# Patient Record
Sex: Male | Born: 1980 | Hispanic: Yes | Marital: Single | Smoking: Never smoker
Health system: Southern US, Community
[De-identification: ages and names within clinical notes are randomized; demographics above are authoritative.]

---

## 2014-03-15 ENCOUNTER — Encounter (HOSPITAL_COMMUNITY): Payer: Self-pay | Admitting: Emergency Medicine

## 2014-03-15 ENCOUNTER — Emergency Department (HOSPITAL_COMMUNITY): Payer: Self-pay

## 2014-03-15 ENCOUNTER — Emergency Department (HOSPITAL_COMMUNITY)
Admission: EM | Admit: 2014-03-15 | Discharge: 2014-03-15 | Disposition: A | Discharge status: Home / Self Care | Payer: Self-pay | Attending: Emergency Medicine | Admitting: Emergency Medicine

## 2014-03-15 DIAGNOSIS — N410 Acute prostatitis: Secondary | ICD-10-CM

## 2014-03-15 DIAGNOSIS — M545 Low back pain, unspecified: Secondary | ICD-10-CM | POA: Insufficient documentation

## 2014-03-15 DIAGNOSIS — Z792 Long term (current) use of antibiotics: Secondary | ICD-10-CM | POA: Insufficient documentation

## 2014-03-15 DIAGNOSIS — R300 Dysuria: Secondary | ICD-10-CM | POA: Insufficient documentation

## 2014-03-15 DIAGNOSIS — R350 Frequency of micturition: Secondary | ICD-10-CM | POA: Insufficient documentation

## 2014-03-15 DIAGNOSIS — Z79899 Other long term (current) drug therapy: Secondary | ICD-10-CM | POA: Insufficient documentation

## 2014-03-15 DIAGNOSIS — R509 Fever, unspecified: Secondary | ICD-10-CM | POA: Insufficient documentation

## 2014-03-15 LAB — CBC WITH DIFFERENTIAL/PLATELET
Basophils Absolute: 0 10*3/uL (ref 0.0–0.1)
Basophils Relative: 0 % (ref 0–1)
EOS PCT: 0 % (ref 0–5)
Eosinophils Absolute: 0 10*3/uL (ref 0.0–0.7)
HEMATOCRIT: 43.6 % (ref 39.0–52.0)
Hemoglobin: 14.9 g/dL (ref 13.0–17.0)
LYMPHS ABS: 1 10*3/uL (ref 0.7–4.0)
Lymphocytes Relative: 12 % (ref 12–46)
MCH: 29.9 pg (ref 26.0–34.0)
MCHC: 34.2 g/dL (ref 30.0–36.0)
MCV: 87.6 fL (ref 78.0–100.0)
MONO ABS: 0.4 10*3/uL (ref 0.1–1.0)
MONOS PCT: 5 % (ref 3–12)
Neutro Abs: 7.1 10*3/uL (ref 1.7–7.7)
Neutrophils Relative %: 83 % — ABNORMAL HIGH (ref 43–77)
PLATELETS: 190 10*3/uL (ref 150–400)
RBC: 4.98 MIL/uL (ref 4.22–5.81)
RDW: 13.3 % (ref 11.5–15.5)
WBC: 8.6 10*3/uL (ref 4.0–10.5)

## 2014-03-15 LAB — URINALYSIS, ROUTINE W REFLEX MICROSCOPIC
Bilirubin Urine: NEGATIVE
Glucose, UA: NEGATIVE mg/dL
Hgb urine dipstick: NEGATIVE
Ketones, ur: NEGATIVE mg/dL
LEUKOCYTES UA: NEGATIVE
Nitrite: NEGATIVE
Protein, ur: NEGATIVE mg/dL
Specific Gravity, Urine: 1.013 (ref 1.005–1.030)
UROBILINOGEN UA: 0.2 mg/dL (ref 0.0–1.0)
pH: 6.5 (ref 5.0–8.0)

## 2014-03-15 LAB — I-STAT CG4 LACTIC ACID, ED: LACTIC ACID, VENOUS: 2.04 mmol/L (ref 0.5–2.2)

## 2014-03-15 LAB — BASIC METABOLIC PANEL
Anion gap: 13 (ref 5–15)
BUN: 12 mg/dL (ref 6–23)
CALCIUM: 9.3 mg/dL (ref 8.4–10.5)
CO2: 24 meq/L (ref 19–32)
Chloride: 102 mEq/L (ref 96–112)
Creatinine, Ser: 0.74 mg/dL (ref 0.50–1.35)
GFR calc Af Amer: >90 mL/min (ref >90)
GFR calc non Af Amer: >90 mL/min (ref >90)
Glucose, Bld: 99 mg/dL (ref 70–99)
Potassium: 3.8 mEq/L (ref 3.7–5.3)
SODIUM: 139 meq/L (ref 137–147)

## 2014-03-15 MED ORDER — IOHEXOL 300 MG/ML  SOLN
25.0000 mL | INTRAMUSCULAR | Status: AC
  Administered 2014-03-15: 25 mL via ORAL

## 2014-03-15 MED ORDER — MORPHINE SULFATE 4 MG/ML IJ SOLN
4.0000 mg | Freq: Once | INTRAMUSCULAR | Status: AC
  Administered 2014-03-15: 4 mg via INTRAVENOUS
  Filled 2014-03-15: qty 1

## 2014-03-15 MED ORDER — OXYCODONE-ACETAMINOPHEN 5-325 MG PO TABS: 1.0000 | ORAL_TABLET | ORAL | Status: DC | PRN

## 2014-03-15 MED ORDER — IOHEXOL 300 MG/ML  SOLN
100.0000 mL | Freq: Once | INTRAMUSCULAR | Status: AC | PRN
  Administered 2014-03-15: 100 mL via INTRAVENOUS

## 2014-03-15 MED ORDER — CIPROFLOXACIN IN D5W 400 MG/200ML IV SOLN
400.0000 mg | Freq: Once | INTRAVENOUS | Status: AC
  Administered 2014-03-15: 400 mg via INTRAVENOUS
  Filled 2014-03-15: qty 200

## 2014-03-15 MED ORDER — IBUPROFEN 800 MG PO TABS: 800.0000 mg | ORAL_TABLET | Freq: Three times a day (TID) | ORAL | Status: DC

## 2014-03-15 MED ORDER — SODIUM CHLORIDE 0.9 % IV BOLUS (SEPSIS)
1000.0000 mL | Freq: Once | INTRAVENOUS | Status: AC
  Administered 2014-03-15: 1000 mL via INTRAVENOUS

## 2014-03-15 NOTE — ED Notes (Signed)
To ct

## 2014-03-15 NOTE — ED Notes (Signed)
Pt reports having lower back pain since yesterday and pain with urination. Denies any back injury or hx of back pain. Ambulatory at triage, no acute distress noted.

## 2014-03-15 NOTE — ED Provider Notes (Signed)
CSN: [id number]     Arrival date & time 03/15/14  7829 History   First MD Initiated Contact with Patient 03/15/14 0912     Chief Complaint  Patient presents with  . Back Pain     (Consider location/radiation/quality/duration/timing/severity/associated sxs/prior Treatment) HPI Comments: Patient is a 33 yo M presenting to the ED for continued low back and suprapubic pain after being diagnosed with acute prostatitis at an [facility] on Saturday. Patient states he has had continued pain and dysuria despite taking the Cipro and Proscar as prescribed. Patient was not given anything for pain. He states he was given two antibiotics at the [facility], one was an injection prior to leaving. He endorses subjective fevers and chills. No abdominal surgical history.   Patient is a 33 y.o. male presenting with back pain.  Back Pain Associated symptoms: dysuria and fever (subjective)     History reviewed. No pertinent past medical history. History reviewed. No pertinent past surgical history. History reviewed. No pertinent family history. History  Substance Use Topics  . Smoking status: Not on file  . Smokeless tobacco: Not on file  . Alcohol Use: No    Review of Systems  Constitutional: Positive for fever (subjective) and chills.  Genitourinary: Positive for dysuria, urgency, frequency and decreased urine volume. Negative for hematuria, flank pain, discharge, penile swelling, scrotal swelling, genital sores, penile pain and testicular pain.  Musculoskeletal: Positive for back pain.  All other systems reviewed and are negative.     Allergies  Review of patient's allergies indicates no known allergies.  Home Medications   Prior to Admission medications   Medication Sig Start Date End Date Taking? Authorizing Provider  ciprofloxacin (CIPRO) 500 MG tablet Take 500 mg by mouth 2 (two) times daily.   Yes Historical Provider, MD  finasteride (PROSCAR) 5 MG tablet Take 5 mg by mouth daily.   Yes Historical  Provider, MD  ibuprofen (ADVIL,MOTRIN) 800 MG tablet Take 1 tablet (800 mg total) by mouth 3 (three) times daily. 03/15/14   Jaking Thayer L Ichiro Chesnut, PA-C  oxyCODONE-acetaminophen (PERCOCET/ROXICET) 5-325 MG per tablet Take 1 tablet by mouth every 4 (four) hours as needed for severe pain. May take 2 tablets PO q 6 hours for severe pain - Do not take with Tylenol as this tablet already contains tylenol 03/15/14   Orrie Lascano L Arshia Spellman, PA-C   BP 119/65  Pulse 77  Temp(Src) 98.3 F (36.8 C) (Oral)  Resp 15  Ht  (1.6 m)  Wt 120 lb (54.432 kg)  BMI 21.26 kg/m2  SpO2 99% Physical Exam  Nursing note and vitals reviewed. Constitutional: He is oriented to person, place, and time. He appears well-developed and well-nourished. No distress.  HENT:  Head: Normocephalic and atraumatic.  Right Ear: External ear normal.  Left Ear: External ear normal.  Nose: Nose normal.  Mouth/Throat: Oropharynx is clear and moist.  Eyes: Conjunctivae are normal.  Neck: Normal range of motion. Neck supple.  Cardiovascular: Normal rate, regular rhythm and normal heart sounds.   Pulmonary/Chest: Effort normal and breath sounds normal. No respiratory distress.  Abdominal: Soft. Bowel sounds are normal. He exhibits no distension. There is tenderness (mild suprapubic). There is no rebound and no guarding.  Genitourinary: Prostate is tender. Prostate is not enlarged.  Brown stool noted on DRE.   Musculoskeletal: Normal range of motion. He exhibits no edema.  Neurological: He is alert and oriented to person, place, and time.  Skin: Skin is warm and dry. He is not diaphoretic.  Psychiatric: He has a normal mood and affect.    ED Course  Procedures (including critical care time) Medications  sodium chloride 0.9 % bolus 1,000 mL (0 mLs Intravenous Stopped 03/15/14 1401)  morphine 4 MG/ML injection 4 mg (4 mg Intravenous Given 03/15/14 1107)  iohexol (OMNIPAQUE) 300 MG/ML solution 25 mL (25 mLs Oral Contrast Given  03/15/14 1031)  ciprofloxacin (CIPRO) IVPB 400 mg (0 mg Intravenous Stopped 03/15/14 1401)  iohexol (OMNIPAQUE) 300 MG/ML solution 100 mL (100 mLs Intravenous Contrast Given 03/15/14 1312)  morphine 4 MG/ML injection 4 mg (4 mg Intravenous Given 03/15/14 1334)    Labs Review Labs Reviewed  CBC WITH DIFFERENTIAL - Abnormal; Notable for the following:    Neutrophils Relative % 83 (*)    All other components within normal limits  URINE CULTURE  URINALYSIS, ROUTINE W REFLEX MICROSCOPIC  BASIC METABOLIC PANEL  I-STAT CG4 LACTIC ACID, ED    Imaging Review Ct Abdomen Pelvis W Contrast  03/15/2014   CLINICAL DATA:  Low back pain, dysuria, recent diagnosis of prostatitis  EXAM: CT ABDOMEN AND PELVIS WITH CONTRAST  TECHNIQUE: Multidetector CT imaging of the abdomen and pelvis was performed using the standard protocol following bolus administration of intravenous contrast.  CONTRAST:  OMNIPAQUE IOHEXOL 300 MG/ML  SOLN  COMPARISON:  None.  FINDINGS: Lung bases are essentially clear.  Liver, spleen, pancreas, and adrenal glands within normal limits.  Gallbladder is unremarkable. No intrahepatic or extrahepatic ductal dilatation.  Kidneys are within normal limits.  No hydronephrosis.  No evidence of bowel obstruction.  Normal appendix.  No evidence of abdominal aortic aneurysm.  No abdominopelvic ascites.  No suspicious abdominopelvic lymphadenopathy.  Prostate is grossly unremarkable on CT.  Moderately distended bladder.  Visualized osseous structures are within normal limits.  IMPRESSION: Prostate is grossly unremarkable on CT.  Moderately distended bladder.  Otherwise, no CT findings to account for the patient's abdominal pain.   Electronically Signed   By: Charline Bills M.D.   On: 03/15/2014 13:46     EKG Interpretation None      MDM   Final diagnoses:  Acute prostatitis    Filed Vitals:   03/15/14 1430  BP: 119/65  Pulse: 77  Temp:   Resp: 15   Afebrile, NAD, non-toxic appearing,  AAOx4.  I have reviewed nursing notes, vital signs, and all appropriate lab and imaging results for this patient.  Patient with continued back and suprapubic pain after being diagnosed with acute prostatitis on Saturday despite taking antibiotic as prescribed. Prostate tender on examination. Abdomen soft, non-tender, non-distended. Labs reviewed, no evidence of spreading infection or abscess on CT scan. Dose of IV Cipro given in ED. Patient already covered for GC[facility]Chlamydia at [facility]. Advised to finish antibiotic course as prescribed. Will provide pain medication for symptom control. Return precautions discussed. Advised Urology f/u. Patient is agreeable to plan. Patient is stable at time of discharge      Jeannetta Ellis, PA-C 03/15/14 1450

## 2014-03-15 NOTE — Discharge Instructions (Signed)
Please follow up with your primary care physician in 1-2 days. If you do not have one please call the [facility] and wellness Center number listed above. Please follow up with Dr. Patsi Sears to schedule a follow up appointment.  Please take your antibiotic until completion. Please take pain medication and/or muscle relaxants as prescribed and as needed for pain. Please do not drive on narcotic pain medication or on muscle relaxants. Please read all discharge instructions and return precautions.    Prostatitis ( Prostatitis) La prstata tiene aproximadamente el tamao y la forma de Sharee Pimple. Se ubica justo debajo de la vejiga. Produce uno de los componentes del semen, formado por los espermatozoides y los lquidos que ayudan a nutrirlos y transportarlos fuera de los testculos. La prostatitis es una inflamacin de la prstata.  Hay cuatro tipos de prostatitis:  Prostatitis bacteriana aguda. Es el tipo menos frecuente de prostatitis. Comienza rpidamente y, por lo general, est acompaada por una infeccin de la vejiga, fiebre alta y escalofros. Puede ocurrir a Actuary.  Prostatitis bacteriana crnica. Es una infeccin bacteriana persistente en la prstata. Generalmente aparece luego de una prostatitis bacteriana aguda que se repite o que no fue tratada adecuadamente. Se puede presentar en hombres de cualquier edad pero es ms comn en los hombres de mediana edad cuya prstata ha comenzado a Government social research officer. Los sntomas no son tan graves como los de la prostatitis [place]. La principal [employer] ser en la parte del cuerpo que est delante del recto y debajo del escroto (perineo), en la parte baja del abdomen o en la punta del pene (glande).   Prostatitis crnica (no bacteriana). Es el tipo ms frecuente de prostatitis. Es la inflamacin de la prstata y no se origina por una infeccin bacteriana. Se desconoce su causa y puede estar asociada con infecciones virales o trastornos  autoinmunitarios.  Prostatodinia (trastorno del suelo plvico). Est asociada con un aumento del tono muscular en la pelvis que rodea a la prstata. CAUSAS La causa de la prostatitis bacteriana es una infeccin bacteriana. Se desconocen las causas de los otros tipos de prostatitis.  SNTOMAS  Los sntomas pueden variar segn el tipo de prostatitis. Tambin puede ocurrir que coincidan sntomas de diferentes tipos de prostatitis. Los sntomas posibles de cada tipo de prostatitis se enumeran a continuacin. Prostatitis bacteriana aguda  Dolor al [employer].  Fiebre o escalofros.  Dolor muscular o en las articulaciones.  Dolor lumbar.  Dolor en la zona inferior del abdomen.  Imposibilidad de vaciar la vejiga completamente. Prostatitis bacteriana crnica, prostatitis crnica no bacteriana y prostatodinia  [place]ongo urgente y repentina de Geographical information systems officer.  Ganas de orinar con frecuencia.  Dificultad para comenzar a eliminar la orina.  Chorro de orina dbil.  Secrecin por Engineer, mining.  Goteo al terminar de Geographical information systems officer.  Dolor rectal.  Dolor en los testculos, el pene o la punta del pene.  Dolor en el perineo.  Problemas con la funcin sexual.  Eyaculacin dolorosa.  Semen con sangre. DIAGNSTICO  Su mdico le preguntar acerca de sus sntomas para hacer el diagnstico de prostatitis. Se recolectarn y analizarn una o ms muestras de [place] (anlisis de [place]). Si el resultado del anlisis de [place] es negativo para bacterias, su mdico podr palpar su prstata con un dedo (examen dgito rectal). Este examen ayuda a su mdico a determinar si la prstata est inflamada y sensible. Tambin se obtendr Lauris Poag de semen para analizarla. TRATAMIENTO  El tratamiento de la prostatitis depende de la causa. Si  la causa es una infeccin [place], se puede tratar con antibiticos. En los casos de prostatitis bacteriana crnica, puede ser necesario el uso de antibiticos durante hasta o  6semanas. Su mdico puede indicarle que tome baos de asiento para ayudar a Engineer, materials. Un bao de asiento es un bao de agua caliente en la que las caderas y las nalgas estn bajo el agua. Esto relaja los msculos del suelo plvico y, a menudo, contribuye a Technical sales engineer presin en la prstata. INSTRUCCIONES PARA EL CUIDADO EN EL HOGAR   Tome todos los medicamentos como le indic el mdico.  Tome baos de asiento segn las indicaciones del mdico. SOLICITE ATENCIN MDICA SI:   Los sntomas empeoran en lugar de mejorar.  Tiene fiebre. SOLICITE ATENCIN MDICA DE INMEDIATO SI:   Tiene escalofros.  Siente nuseas o vomita.  Se siente mareado o se desmaya.  No puede orinar.  Tiene sangre o cogulos de sangre en la orina. ASEGRESE DE QUE:  Comprende estas instrucciones.  Controlar su afeccin.  Recibir ayuda de inmediato si no mejora o si empeora. Document Released: 04/17/2005 Document Revised: 07/13/2013 [facility] Patient Information 2015 [place], [place]. This information is not intended to replace advice given to you by your health care provider. Make sure you discuss any questions you have with your health care provider.

## 2014-03-15 NOTE — ED Provider Notes (Signed)
Medical screening examination/treatment/procedure(s) were performed by non-physician practitioner and as supervising physician I was immediately available for consultation/collaboration.     Suraiya Dickerson, MD 03/15/14 1635 

## 2014-03-15 NOTE — ED Notes (Signed)
Lab results was given to Dr.Delo.

## 2014-03-15 NOTE — ED Notes (Signed)
Iv therapist returned call will come down as soon as possible

## 2014-03-15 NOTE — ED Notes (Signed)
Call placed to iv therapy 

## 2014-03-15 NOTE — ED Notes (Signed)
C/op lower back pain onset last week worse today. Denies injury.

## 2014-03-15 NOTE — ED Notes (Signed)
Remains in ct

## 2014-03-15 NOTE — ED Notes (Signed)
Call placed to iv therapy

## 2014-03-16 LAB — URINE CULTURE
Colony Count: NO GROWTH
Culture: NO GROWTH

## 2014-03-31 ENCOUNTER — Emergency Department (HOSPITAL_COMMUNITY)
Admission: EM | Admit: 2014-03-31 | Discharge: 2014-04-01 | Disposition: A | Discharge status: Home / Self Care | Payer: Self-pay | Attending: Emergency Medicine | Admitting: Emergency Medicine

## 2014-03-31 ENCOUNTER — Encounter (HOSPITAL_COMMUNITY): Payer: Self-pay | Admitting: Emergency Medicine

## 2014-03-31 DIAGNOSIS — Z792 Long term (current) use of antibiotics: Secondary | ICD-10-CM | POA: Insufficient documentation

## 2014-03-31 DIAGNOSIS — R509 Fever, unspecified: Secondary | ICD-10-CM | POA: Insufficient documentation

## 2014-03-31 DIAGNOSIS — N4281 Prostatodynia syndrome: Secondary | ICD-10-CM

## 2014-03-31 DIAGNOSIS — N4289 Other specified disorders of prostate: Secondary | ICD-10-CM | POA: Insufficient documentation

## 2014-03-31 DIAGNOSIS — Z791 Long term (current) use of non-steroidal anti-inflammatories (NSAID): Secondary | ICD-10-CM | POA: Insufficient documentation

## 2014-03-31 NOTE — ED Notes (Signed)
Pt was seen at Urgent Care today and dx with epididimytis, he received rocephin and toradol, he complains of rectal pain and hip pain for two days also

## 2014-04-01 ENCOUNTER — Encounter (HOSPITAL_COMMUNITY): Payer: Self-pay | Admitting: Emergency Medicine

## 2014-04-01 LAB — CBC WITH DIFFERENTIAL/PLATELET
Basophils Absolute: 0 10*3/uL (ref 0.0–0.1)
Basophils Relative: 0 % (ref 0–1)
EOS PCT: 0 % (ref 0–5)
Eosinophils Absolute: 0 10*3/uL (ref 0.0–0.7)
HEMATOCRIT: 41.2 % (ref 39.0–52.0)
Hemoglobin: 14.4 g/dL (ref 13.0–17.0)
LYMPHS ABS: 1.6 10*3/uL (ref 0.7–4.0)
LYMPHS PCT: 22 % (ref 12–46)
MCH: 30.6 pg (ref 26.0–34.0)
MCHC: 35 g/dL (ref 30.0–36.0)
MCV: 87.7 fL (ref 78.0–100.0)
Monocytes Absolute: 0.5 10*3/uL (ref 0.1–1.0)
Monocytes Relative: 7 % (ref 3–12)
Neutro Abs: 5.2 10*3/uL (ref 1.7–7.7)
Neutrophils Relative %: 71 % (ref 43–77)
Platelets: 261 10*3/uL (ref 150–400)
RBC: 4.7 MIL/uL (ref 4.22–5.81)
RDW: 13.2 % (ref 11.5–15.5)
WBC: 7.3 10*3/uL (ref 4.0–10.5)

## 2014-04-01 LAB — BASIC METABOLIC PANEL
Anion gap: 11 (ref 5–15)
BUN: 12 mg/dL (ref 6–23)
CO2: 27 meq/L (ref 19–32)
CREATININE: 0.76 mg/dL (ref 0.50–1.35)
Calcium: 9.5 mg/dL (ref 8.4–10.5)
Chloride: 102 mEq/L (ref 96–112)
GFR calc Af Amer: >90 mL/min (ref >90)
Glucose, Bld: 114 mg/dL — ABNORMAL HIGH (ref 70–99)
Potassium: 4.3 mEq/L (ref 3.7–5.3)
SODIUM: 140 meq/L (ref 137–147)

## 2014-04-01 LAB — URINALYSIS, ROUTINE W REFLEX MICROSCOPIC
Bilirubin Urine: NEGATIVE
GLUCOSE, UA: NEGATIVE mg/dL
HGB URINE DIPSTICK: NEGATIVE
Ketones, ur: NEGATIVE mg/dL
LEUKOCYTES UA: NEGATIVE
Nitrite: NEGATIVE
PH: 6 (ref 5.0–8.0)
PROTEIN: NEGATIVE mg/dL
Specific Gravity, Urine: 1.014 (ref 1.005–1.030)
Urobilinogen, UA: 0.2 mg/dL (ref 0.0–1.0)

## 2014-04-01 LAB — SEDIMENTATION RATE: SED RATE: 10 mm/h (ref 0–16)

## 2014-04-01 MED ORDER — OXYCODONE-ACETAMINOPHEN 5-325 MG PO TABS
2.0000 | ORAL_TABLET | Freq: Once | ORAL | Status: AC
  Administered 2014-04-01: 2 via ORAL
  Filled 2014-04-01: qty 2

## 2014-04-01 MED ORDER — OXYCODONE-ACETAMINOPHEN 5-325 MG PO TABS: 2.0000 | ORAL_TABLET | ORAL | Status: DC | PRN

## 2014-04-01 NOTE — Discharge Instructions (Signed)
Please purchase a thermometer and keep a record when your temperature is above 100.4  You can take ibuprofen or tylenol for fevers.  Percocet has tyenol in it so do not double dose!  Follow up in 1 week with your doctor for recheck of your pain.  Please bring any records of your fevers.  Return to the ER for worsening condition or new concerning symptoms.   Por favor, compre un termmetro y Pharmacologist un registro cuando su temperatura es superior a 100,4 Usted puede tomar ibuprofeno o Tylenol para las fiebres. Percocet tiene tyenol en ella, as no lo hacen dosis doble! Seguimiento en 1 semana con su mdico para obtener? Servicios de Agricultural consultant. Por favor traer [employer] documentos de sus fiebres. Regreso a la sala de urgencias por empeoramiento de condiciones o nuevos referentes sntomas.

## 2014-04-01 NOTE — ED Provider Notes (Signed)
CSN: [id number]     Arrival date & time 03/31/14  2007 History   First MD Initiated Contact with Patient 04/01/14 0021     Chief Complaint  Patient presents with  . Fever     (Consider location/radiation/quality/duration/timing/severity/associated sxs/prior Treatment) HPI 33 year old male presents to emergency department with complaint of fevers for 2 weeks, back pain and genital pain for 2 weeks.  Fever is subjective, and characterized by trembling and feeling hot all over.  Patient has been seen in the emergency department under a different name,  Kota Ciancio, on 8/25 and diagnosed with acute prostatitis.  Patient was treated for GC and Chlamydia and placed on Cipro.  He followed up with urology, and her patient, was told he had muscle swelling and not prostatitis.  Patient has had persistent symptoms.  He was seen at urgent care today and diagnosed with epididymitis.  He was given Rocephin and Toradol which helped with pain slightly.  Patient complains of pain in the perineum.  Patient afebrile here.  He denies any dysuria or penile discharge.  No prior history of same. History reviewed. No pertinent past medical history. History reviewed. No pertinent past surgical history. History reviewed. No pertinent family history. History  Substance Use Topics  . Smoking status: Never Smoker   . Smokeless tobacco: Not on file  . Alcohol Use: No    Review of Systems   See History of Present Illness; otherwise all other systems are reviewed and negative  Allergies  Review of patient's allergies indicates no known allergies.  Home Medications   Prior to Admission medications   Medication Sig Start Date End Date Taking? Authorizing Provider  cefTRIAXone (ROCEPHIN) 250 MG injection Inject 250 mg into the muscle once. FOR IM use in LARGE MUSCLE MASS   Yes Historical Provider, MD  doxycycline (DORYX) 100 MG DR capsule Take 100 mg by mouth 2 (two) times daily. For 7 days   Yes Historical  Provider, MD  ibuprofen (ADVIL,MOTRIN) 800 MG tablet Take 800 mg by mouth 2 (two) times daily as needed for fever.   Yes Historical Provider, MD  ketorolac (TORADOL) 60 MG/2ML SOLN injection Inject 60 mg into the muscle once.   Yes Historical Provider, MD   BP 122/71  Pulse 82  Temp(Src) 98.2 F (36.8 C) (Oral)  Resp 18  SpO2 100% Physical Exam  Nursing note and vitals reviewed. Constitutional: He is oriented to person, place, and time. He appears well-developed and well-nourished.  HENT:  Head: Normocephalic and atraumatic.  Nose: Nose normal.  Mouth/Throat: Oropharynx is clear and moist.  Eyes: Conjunctivae and EOM are normal. Pupils are equal, round, and reactive to light.  Neck: Normal range of motion. Neck supple. No JVD present. No tracheal deviation present. No thyromegaly present.  Cardiovascular: Normal rate, regular rhythm, normal heart sounds and intact distal pulses.  Exam reveals no gallop and no friction rub.   No murmur heard. Pulmonary/Chest: Effort normal and breath sounds normal. No stridor. No respiratory distress. He has no wheezes. He has no rales. He exhibits no tenderness.  Abdominal: Soft. Bowel sounds are normal. He exhibits no distension and no mass. There is no tenderness. There is no rebound and no guarding.  Genitourinary: Rectum normal and penis normal.  Patient without penile discharge.  He has mild tenderness bilaterally in testicles.  He is exquisitely tender in the perineum and on rectal exam over the prostate without bogginess noted  Musculoskeletal: Normal range of motion. He exhibits no edema and  no tenderness.  Lymphadenopathy:    He has no cervical adenopathy.  Neurological: He is alert and oriented to person, place, and time. He displays normal reflexes. He exhibits normal muscle tone. Coordination normal.  Skin: Skin is warm and dry. No rash noted. No erythema. No pallor.  Psychiatric: He has a normal mood and affect. His behavior is normal.  Judgment and thought content normal.    ED Course  Procedures (including critical care time) Labs Review Labs Reviewed  BASIC METABOLIC PANEL - Abnormal; Notable for the following:    Glucose, Bld 114 (*)    All other components within normal limits  CULTURE, BLOOD (ROUTINE X 2)  CULTURE, BLOOD (ROUTINE X 2)  URINALYSIS, ROUTINE W REFLEX MICROSCOPIC  CBC WITH DIFFERENTIAL  SEDIMENTATION RATE    Imaging Review No results found.   EKG Interpretation None      MDM   Final diagnoses:  Prostate pain    33 year old male with persistent perineal and genital pain with reported fevers.  Patient advised that he needs to buy a thermometer and document his fevers.  He was seen at urgent care earlier today, or and it was placed on doxycycline.  Blood Cultures and sedimentation rate were sent.  Will have patient follow back up with urgent care in one week for recheck of fevers and perineal pain.  Patient may need referral back to urology.    Olivia Mackie, MD 04/01/14 [phone]

## 2014-04-07 LAB — CULTURE, BLOOD (ROUTINE X 2)
Culture: NO GROWTH
Culture: NO GROWTH

## 2019-01-16 ENCOUNTER — Ambulatory Visit (HOSPITAL_COMMUNITY)
Admission: EM | Admit: 2019-01-16 | Discharge: 2019-01-16 | Disposition: A | Discharge status: Home / Self Care | Payer: Self-pay | Attending: Physician Assistant | Admitting: Physician Assistant

## 2019-01-16 ENCOUNTER — Encounter (HOSPITAL_COMMUNITY): Payer: Self-pay

## 2019-01-16 ENCOUNTER — Other Ambulatory Visit: Payer: Self-pay

## 2019-01-16 DIAGNOSIS — K219 Gastro-esophageal reflux disease without esophagitis: Secondary | ICD-10-CM

## 2019-01-16 DIAGNOSIS — R1032 Left lower quadrant pain: Secondary | ICD-10-CM

## 2019-01-16 MED ORDER — POLYETHYLENE GLYCOL 3350 17 G PO PACK: 17.0000 g | PACK | Freq: Every day | ORAL | 0 refills | Status: AC

## 2019-01-16 MED ORDER — OMEPRAZOLE 20 MG PO CPDR: 20.0000 mg | DELAYED_RELEASE_CAPSULE | Freq: Every day | ORAL | 0 refills | Status: AC

## 2019-01-16 NOTE — ED Provider Notes (Signed)
Clifford Carter    CSN: [id number] Arrival date & time: 01/16/19  1058     History   Chief Complaint Chief Complaint  Patient presents with  . Abdominal Pain    HPI Clifford Carter is a 38 y.o. male.   38 year old male comes in for 2-day history of epigastric pain.  Describes pain as burning sensation, intermittent without any aggravating or alleviating factor.  Denies nausea, vomiting.  Mild constipation.  Subjective fever when symptoms first started but has since resolved.  Denies chest pain, shortness of breath, weakness, dizziness, syncope.  Denies excessive alcohol use.  Denies drug use.  Never smoker.  Tylenol with mild relief.      History reviewed. No pertinent past medical history.  There are no active problems to display for this patient.   History reviewed. No pertinent surgical history.     Home Medications    Prior to Admission medications   Medication Sig Start Date End Date Taking? Authorizing Provider  finasteride (PROSCAR) 5 MG tablet Take 5 mg by mouth daily.    [provider]  ibuprofen (ADVIL,MOTRIN) 800 MG tablet Take 800 mg by mouth 2 (two) times daily as needed for fever.    [provider]  omeprazole (PRILOSEC) 20 MG capsule Take 1 capsule (20 mg total) by mouth daily. 01/16/19   Tasia Catchings, Alastair Hennes V, PA-C  polyethylene glycol (MIRALAX) 17 g packet Take 17 g by mouth daily. 01/16/19   Ok Edwards, PA-C    Family History Family History  Family history unknown: Yes    Social History Social History   Tobacco Use  . Smoking status: Never Smoker  Substance Use Topics  . Alcohol use: No  . Drug use: No     Allergies   Patient has no known allergies.   Review of Systems Review of Systems  Reason unable to perform ROS: See HPI as above.     Physical Exam Triage Vital Signs ED Triage Vitals  Enc Vitals Group     BP 01/16/19 1115 101/72     Pulse Rate 01/16/19 1115 72     Resp 01/16/19 1115 17     Temp  01/16/19 1115 97.6 F (36.4 C)     Temp Source 01/16/19 1115 Oral     SpO2 01/16/19 1115 95 %     Weight --      Height --      Head Circumference --      Peak Flow --      Pain Score 01/16/19 1116 6     Pain Loc --      Pain Edu? --      Excl. in [place]? --    No data found.  Updated Vital Signs BP 101/72 (BP Location: Left Arm)   Pulse 72   Temp 97.6 F (36.4 C) (Oral)   Resp 17   SpO2 95%   Physical Exam Constitutional:      General: He is not in acute distress.    Appearance: He is well-developed. He is not ill-appearing, toxic-appearing or diaphoretic.  HENT:     Head: Normocephalic and atraumatic.  Cardiovascular:     Rate and Rhythm: Normal rate and regular rhythm.     Heart sounds: Normal heart sounds. No murmur. No friction rub. No gallop.   Pulmonary:     Effort: Pulmonary effort is normal.     Breath sounds: Normal breath sounds. No wheezing or rales.  Abdominal:  General: Bowel sounds are normal.     Palpations: Abdomen is soft.     Tenderness: There is abdominal tenderness in the left lower quadrant. There is no right CVA tenderness, left CVA tenderness, guarding or rebound.  Skin:    General: Skin is warm and dry.  Neurological:     Mental Status: He is alert and oriented to person, place, and time.  Psychiatric:        Behavior: Behavior normal.        Judgment: Judgment normal.    UC Treatments / Results  Labs (all labs ordered are listed, but only abnormal results are displayed) Labs Reviewed - No data to display  EKG None  Radiology No results found.  Procedures Procedures (including critical care time)  Medications Ordered in UC Medications - No data to display  Initial Impression / Assessment and Plan / UC Course  I have reviewed the triage vital signs and the nursing notes.  Pertinent labs & imaging results that were available during my care of the patient were reviewed by me and considered in my medical decision making (see  chart for details).    No alarming signs on exam.  Patient with left lower quadrant tenderness to palpation.  No tenderness to palpation of epigastric region, though perceived pain is at that location.  Will treat for GERD and constipation that may be causing symptoms.  Push fluids.  Return precautions given.  Patient expresses understanding and agrees to plan.  Final Clinical Impressions(s) / UC Diagnoses   Final diagnoses:  Left lower quadrant abdominal pain  Gastroesophageal reflux disease without esophagitis    ED Prescriptions    Medication Sig Dispense Auth. Provider   polyethylene glycol (MIRALAX) 17 g packet Take 17 g by mouth daily. 14 each Cathie HoopsYu, Morena Mckissack V, PA-C   omeprazole (PRILOSEC) 20 MG capsule Take 1 capsule (20 mg total) by mouth daily. 14 capsule Threasa AlphaYu, Mirha Brucato V, PA-C        Georgenia Salim V, [place] JerseyPA-C 01/17/19 1002

## 2019-01-16 NOTE — ED Triage Notes (Signed)
Pt presents with generalized abdominal pain X 2 days; pt describes the pain as burning.

## 2019-01-16 NOTE — Discharge Instructions (Signed)
Start omeprazole for acid reflux. Start miralax to help move the bowels. Keep hydrated, urine should be clear to pale yellow in color. If experiencing worsening abdominal pain, follow up for recheck. If experiencing chest pain, shortness of breath, nausea/vomiting, go to the ED for further evaluation needed.

## 2019-03-22 ENCOUNTER — Other Ambulatory Visit: Payer: Self-pay

## 2019-03-22 ENCOUNTER — Encounter (HOSPITAL_COMMUNITY): Payer: Self-pay | Admitting: Emergency Medicine

## 2019-03-22 ENCOUNTER — Emergency Department (HOSPITAL_COMMUNITY)
Admission: EM | Admit: 2019-03-22 | Discharge: 2019-03-22 | Disposition: A | Discharge status: Home / Self Care | Payer: Self-pay | Attending: Emergency Medicine | Admitting: Emergency Medicine

## 2019-03-22 ENCOUNTER — Emergency Department (HOSPITAL_COMMUNITY): Payer: Self-pay

## 2019-03-22 DIAGNOSIS — M549 Dorsalgia, unspecified: Secondary | ICD-10-CM | POA: Insufficient documentation

## 2019-03-22 DIAGNOSIS — R300 Dysuria: Secondary | ICD-10-CM | POA: Insufficient documentation

## 2019-03-22 DIAGNOSIS — R39198 Other difficulties with micturition: Secondary | ICD-10-CM | POA: Insufficient documentation

## 2019-03-22 DIAGNOSIS — R109 Unspecified abdominal pain: Secondary | ICD-10-CM | POA: Insufficient documentation

## 2019-03-22 DIAGNOSIS — Z79899 Other long term (current) drug therapy: Secondary | ICD-10-CM | POA: Insufficient documentation

## 2019-03-22 LAB — LIPASE, BLOOD: Lipase: 27 U/L (ref 11–51)

## 2019-03-22 LAB — HEPATIC FUNCTION PANEL
ALT: 19 U/L (ref 0–44)
AST: 19 U/L (ref 15–41)
Albumin: 3.9 g/dL (ref 3.5–5.0)
Alkaline Phosphatase: 94 U/L (ref 38–126)
Bilirubin, Direct: 0.1 mg/dL (ref 0.0–0.2)
Indirect Bilirubin: 0.5 mg/dL (ref 0.3–0.9)
Total Bilirubin: 0.6 mg/dL (ref 0.3–1.2)
Total Protein: 7.3 g/dL (ref 6.5–8.1)

## 2019-03-22 LAB — CBC
HCT: 43.9 % (ref 39.0–52.0)
Hemoglobin: 14.4 g/dL (ref 13.0–17.0)
MCH: 29.9 pg (ref 26.0–34.0)
MCHC: 32.8 g/dL (ref 30.0–36.0)
MCV: 91.1 fL (ref 80.0–100.0)
Platelets: 220 10*3/uL (ref 150–400)
RBC: 4.82 MIL/uL (ref 4.22–5.81)
RDW: 13.2 % (ref 11.5–15.5)
WBC: 5.1 10*3/uL (ref 4.0–10.5)
nRBC: 0 % (ref 0.0–0.2)

## 2019-03-22 LAB — BASIC METABOLIC PANEL
Anion gap: 8 (ref 5–15)
BUN: 10 mg/dL (ref 6–20)
CO2: 23 mmol/L (ref 22–32)
Calcium: 8.9 mg/dL (ref 8.9–10.3)
Chloride: 107 mmol/L (ref 98–111)
Creatinine, Ser: 0.78 mg/dL (ref 0.61–1.24)
GFR calc Af Amer: >60 mL/min (ref >60)
GFR calc non Af Amer: >60 mL/min (ref >60)
Glucose, Bld: 106 mg/dL — ABNORMAL HIGH (ref 70–99)
Potassium: 3.8 mmol/L (ref 3.5–5.1)
Sodium: 138 mmol/L (ref 135–145)

## 2019-03-22 LAB — URINALYSIS, ROUTINE W REFLEX MICROSCOPIC
Bilirubin Urine: NEGATIVE
Glucose, UA: NEGATIVE mg/dL
Hgb urine dipstick: NEGATIVE
Ketones, ur: NEGATIVE mg/dL
Leukocytes,Ua: NEGATIVE
Nitrite: NEGATIVE
Protein, ur: NEGATIVE mg/dL
Specific Gravity, Urine: 1.013 (ref 1.005–1.030)
pH: 6 (ref 5.0–8.0)

## 2019-03-22 MED ORDER — IOHEXOL 300 MG/ML  SOLN
100.0000 mL | Freq: Once | INTRAMUSCULAR | Status: AC | PRN
  Administered 2019-03-22: 14:00:00 100 mL via INTRAVENOUS

## 2019-03-22 MED ORDER — URIBEL 118 MG PO CAPS: 1.0000 | ORAL_CAPSULE | Freq: Three times a day (TID) | ORAL | 0 refills | Status: AC | PRN

## 2019-03-22 NOTE — ED Notes (Signed)
Per lab- able to add on hepatic function panel to previous lab work

## 2019-03-22 NOTE — ED Triage Notes (Signed)
Pt with lower back pain and dysuria without hematuria for 1 week. Denies fever, nausea or vomiting. VSS at triage.

## 2019-03-22 NOTE — ED Notes (Signed)
Patient transported to CT 

## 2019-03-22 NOTE — ED Provider Notes (Signed)
[facility] EMERGENCY DEPARTMENT Provider Note   CSN: [id number] Arrival date & time: 03/22/19  1142     History   Chief Complaint Chief Complaint  Patient presents with  . Back Pain  . Dysuria    HPI Clifford Carter is a 38 y.o. male.     The history is provided by the patient and medical records.  Back Pain Associated symptoms: dysuria   Dysuria Presenting symptoms: dysuria    Clifford Carter is a 38 y.o. male who presents to the Emergency Department complaining of bilateral low back pain L>R x 1 week. Associated with dysuria and difficulty urinating. Denies n/v. No diarrhea or constipation.  No fever or chills.  No pain with bowel movements.  Denies any history of similar.  No medications taken prior to arrival for symptoms.  Denies alleviating or aggravating factors.  No trauma or inciting event.  No numbness or weakness.  No saddle anesthesia.   History reviewed. No pertinent past medical history.  There are no active problems to display for this patient.   History reviewed. No pertinent surgical history.      Home Medications    Prior to Admission medications   Medication Sig Start Date End Date Taking? Authorizing Provider  finasteride (PROSCAR) 5 MG tablet Take 5 mg by mouth daily.    [provider]  ibuprofen (ADVIL,MOTRIN) 800 MG tablet Take 800 mg by mouth 2 (two) times daily as needed for fever.    [provider]  omeprazole (PRILOSEC) 20 MG capsule Take 1 capsule (20 mg total) by mouth daily. 01/16/19   Cathie Hoops, Amy V, PA-C  polyethylene glycol (MIRALAX) 17 g packet Take 17 g by mouth daily. 01/16/19   Belinda Fisher, PA-C    Family History Family History  Family history unknown: Yes    Social History Social History   Tobacco Use  . Smoking status: Never Smoker  . Smokeless tobacco: Never Used  Substance Use Topics  . Alcohol use: No  . Drug use: No     Allergies   Patient has no known allergies.    Review of Systems Review of Systems  Genitourinary: Positive for dysuria.  Musculoskeletal: Positive for back pain.  All other systems reviewed and are negative.    Physical Exam Updated Vital Signs BP 123/79   Pulse 73   Temp 98 F (36.7 C) (Oral)   Resp 14   SpO2 97%   Physical Exam Vitals signs and nursing note reviewed.  Constitutional:      General: He is not in acute distress.    Appearance: He is well-developed.     Comments: Well appearing.  HENT:     Head: Normocephalic and atraumatic.  Neck:     Musculoskeletal: Neck supple.  Cardiovascular:     Rate and Rhythm: Normal rate and regular rhythm.     Heart sounds: Normal heart sounds. No murmur.  Pulmonary:     Effort: Pulmonary effort is normal. No respiratory distress.     Breath sounds: Normal breath sounds.  Abdominal:     General: There is no distension.     Palpations: Abdomen is soft.     Tenderness: There is no abdominal tenderness.  Genitourinary:    Comments: Chaperone present for exam. No discharge from penis. No signs of lesion or erythema on the penis or testicles. The penis and testicles are nontender. No testicular masses or swelling. No signs of any inguinal hernias. Musculoskeletal:  Comments: No midline C/T/L-spine tenderness.  Bilateral lower extremities with full range of motion and 5/5 muscle strength.  Skin:    General: Skin is warm and dry.  Neurological:     Mental Status: He is alert and oriented to person, place, and time.     Comments: Bilateral lower extremities neurovascularly intact.       ED Treatments / Results  Labs (all labs ordered are listed, but only abnormal results are displayed) Labs Reviewed  BASIC METABOLIC PANEL - Abnormal; Notable for the following components:      Result Value   Glucose, Bld 106 (*)    All other components within normal limits  URINE CULTURE  CBC  LIPASE, BLOOD  URINALYSIS, ROUTINE W REFLEX MICROSCOPIC  HEPATIC FUNCTION PANEL   GC/CHLAMYDIA PROBE AMP ([place]) NOT AT [facility]    EKG None  Radiology Ct Abdomen Pelvis W Contrast  Result Date: 03/22/2019 CLINICAL DATA:  Abdominal pain, back pain, dysuria EXAM: CT ABDOMEN AND PELVIS WITH CONTRAST TECHNIQUE: Multidetector CT imaging of the abdomen and pelvis was performed using the standard protocol following bolus administration of intravenous contrast. CONTRAST:  174mL OMNIPAQUE IOHEXOL 300 MG/ML  SOLN COMPARISON:  03/15/2014 FINDINGS: Lower chest: No acute abnormality. Hepatobiliary: No solid liver abnormality is seen. No gallstones, gallbladder wall thickening, or biliary dilatation. Pancreas: Unremarkable. No pancreatic ductal dilatation or surrounding inflammatory changes. Spleen: Normal in size without significant abnormality. Adrenals/Urinary Tract: Adrenal glands are unremarkable. Kidneys are normal, without renal calculi, solid lesion, or hydronephrosis. Mild thickening of the urinary bladder. Stomach/Bowel: Stomach is within normal limits. Appendix appears normal. No evidence of bowel wall thickening, distention, or inflammatory changes. Vascular/Lymphatic: No significant vascular findings are present. No enlarged abdominal or pelvic lymph nodes. Reproductive: No mass or other significant abnormality. Other: No abdominal wall hernia or abnormality. No abdominopelvic ascites. Musculoskeletal: No acute or significant osseous findings. IMPRESSION: 1. Mild thickening of the urinary bladder, suggestive of nonspecific infectious or inflammatory cystitis. Correlate with urinalysis. 2.  No evidence of urinary tract calculus or hydronephrosis. Electronically Signed   By: Eddie Candle M.D.   On: 03/22/2019 14:37    Procedures Procedures (including critical care time)  Medications Ordered in ED Medications  iohexol (OMNIPAQUE) 300 MG/ML solution 100 mL (100 mLs Intravenous Contrast Given 03/22/19 1424)     Initial Impression / Assessment and Plan / ED Course  I have  reviewed the triage vital signs and the nursing notes.  Pertinent labs & imaging results that were available during my care of the patient were reviewed by me and considered in my medical decision making (see chart for details).       Clifford Carter is a 38 y.o. male who presents to ED for several days of dysuria and low back discomfort.  He is well-appearing on exam, afebrile and hemodynamically stable.  No reproducible abdominal pain or back pain.  Normal GU exam.  Blood work reviewed and reassuring.  CT scan shows mild thickening of the urinary bladder suggestive of infectious versus inflammatory cystitis.  Urinalysis is normal without any signs of infection.  Sent for culture.  Gonorrhea and Chlamydia testing also sent as well.  Given dysuria and CT findings, will refer to urology.  Reasons to return to ER were discussed and all questions were answered.  Patient discussed with Dr. Roslynn Amble who agrees with treatment plan.    Final Clinical Impressions(s) / ED Diagnoses   Final diagnoses:  Dysuria    ED Discharge Orders  None       Consuella Scurlock, Chase PicketJaime Pilcher, PA-C 03/22/19 1508    Milagros Lollykstra, Richard S, MD 03/23/19 [phone]

## 2019-03-22 NOTE — Discharge Instructions (Signed)
It was my pleasure taking care of you today!   Please call the urologist listed today or tomorrow morning to schedule a follow up appointment.   Return to ER for new or worsening symptoms, any additional concerns.

## 2019-03-23 LAB — URINE CULTURE: Culture: NO GROWTH

## 2019-03-23 LAB — GC/CHLAMYDIA PROBE AMP (~~LOC~~) NOT AT ARMC
Chlamydia: NEGATIVE
Neisseria Gonorrhea: NEGATIVE

## 2020-04-03 ENCOUNTER — Emergency Department (HOSPITAL_COMMUNITY)
Admission: EM | Admit: 2020-04-03 | Discharge: 2020-04-04 | Disposition: A | Discharge status: Home / Self Care | Payer: Self-pay | Attending: Emergency Medicine | Admitting: Emergency Medicine

## 2020-04-03 ENCOUNTER — Encounter (HOSPITAL_COMMUNITY): Payer: Self-pay | Admitting: Emergency Medicine

## 2020-04-03 ENCOUNTER — Emergency Department (HOSPITAL_COMMUNITY): Payer: Self-pay

## 2020-04-03 ENCOUNTER — Other Ambulatory Visit: Payer: Self-pay

## 2020-04-03 DIAGNOSIS — R0789 Other chest pain: Secondary | ICD-10-CM | POA: Insufficient documentation

## 2020-04-03 DIAGNOSIS — R110 Nausea: Secondary | ICD-10-CM | POA: Insufficient documentation

## 2020-04-03 DIAGNOSIS — R1013 Epigastric pain: Secondary | ICD-10-CM | POA: Insufficient documentation

## 2020-04-03 LAB — CBC
HCT: 45.9 % (ref 39.0–52.0)
Hemoglobin: 14.9 g/dL (ref 13.0–17.0)
MCH: 29.3 pg (ref 26.0–34.0)
MCHC: 32.5 g/dL (ref 30.0–36.0)
MCV: 90.4 fL (ref 80.0–100.0)
Platelets: 221 10*3/uL (ref 150–400)
RBC: 5.08 MIL/uL (ref 4.22–5.81)
RDW: 12.9 % (ref 11.5–15.5)
WBC: 5.6 10*3/uL (ref 4.0–10.5)
nRBC: 0 % (ref 0.0–0.2)

## 2020-04-03 LAB — TROPONIN I (HIGH SENSITIVITY): Troponin I (High Sensitivity): <2 ng/L (ref <18)

## 2020-04-03 LAB — BASIC METABOLIC PANEL
Anion gap: 11 (ref 5–15)
BUN: 12 mg/dL (ref 6–20)
CO2: 21 mmol/L — ABNORMAL LOW (ref 22–32)
Calcium: 9.3 mg/dL (ref 8.9–10.3)
Chloride: 106 mmol/L (ref 98–111)
Creatinine, Ser: 0.88 mg/dL (ref 0.61–1.24)
GFR calc Af Amer: >60 mL/min (ref >60)
GFR calc non Af Amer: >60 mL/min (ref >60)
Glucose, Bld: 100 mg/dL — ABNORMAL HIGH (ref 70–99)
Potassium: 3.6 mmol/L (ref 3.5–5.1)
Sodium: 138 mmol/L (ref 135–145)

## 2020-04-03 NOTE — ED Triage Notes (Signed)
Pt c/o epigastric pain x 2 weeks, denies n/v/d, chest pressure x 2 days. Denies cough or fever.

## 2020-04-04 LAB — URINALYSIS, ROUTINE W REFLEX MICROSCOPIC
Bilirubin Urine: NEGATIVE
Glucose, UA: NEGATIVE mg/dL
Hgb urine dipstick: NEGATIVE
Ketones, ur: NEGATIVE mg/dL
Leukocytes,Ua: NEGATIVE
Nitrite: NEGATIVE
Protein, ur: NEGATIVE mg/dL
Specific Gravity, Urine: 1.006 (ref 1.005–1.030)
pH: 7 (ref 5.0–8.0)

## 2020-04-04 LAB — HEPATIC FUNCTION PANEL
ALT: 19 U/L (ref 0–44)
AST: 22 U/L (ref 15–41)
Albumin: 4.2 g/dL (ref 3.5–5.0)
Alkaline Phosphatase: 93 U/L (ref 38–126)
Bilirubin, Direct: 0.1 mg/dL (ref 0.0–0.2)
Indirect Bilirubin: 0.5 mg/dL (ref 0.3–0.9)
Total Bilirubin: 0.6 mg/dL (ref 0.3–1.2)
Total Protein: 7.5 g/dL (ref 6.5–8.1)

## 2020-04-04 LAB — TROPONIN I (HIGH SENSITIVITY): Troponin I (High Sensitivity): 4 ng/L (ref <18)

## 2020-04-04 LAB — LIPASE, BLOOD: Lipase: 27 U/L (ref 11–51)

## 2020-04-04 MED ORDER — OMEPRAZOLE 20 MG PO CPDR: 20.0000 mg | DELAYED_RELEASE_CAPSULE | Freq: Every day | ORAL | 0 refills | Status: AC

## 2020-04-04 NOTE — ED Provider Notes (Signed)
[facility] EMERGENCY DEPARTMENT Provider Note   CSN: [id number] Arrival date & time: 04/03/20  1848     History Chief Complaint  Patient presents with  . Chest Pain    Clifford Carter is a 39 y.o. male.  HPI HPI was collected using a Spanish interpreter   Patient with no significant medical history presents to the emergency department with chief complaint of epigastric pain that has been going for 3 weeks as well as chest tightness for last 2 days.  Patient explains he feels this tight, burning sensation in his epigastric region, describes it as feeling like there is swelling in that area.  He also admits to occasional chest tightness on the left side when he has this epigastric pain.  He denies associated symptoms like becoming diaphoretic, chest pain that radiates, paresthesias in upper or lower extremities, lightheadedness or dizziness, shortness of breath or pedal edema.  He has no cardiac history does not smoke, denies exertion making this pain worse.  He states he mainly feels his epigastric pain when he lays down especially in the morning when he first wakes up.  He has no history of acid reflux or stomach ulcers, denies alcohol use, NSAID use.  Denies any alleviating factors.  Patient denies headache, fever, chills, shortness breath, abdominal pain, vomiting, diarrhea, dysuria, pedal edema.  History reviewed. No pertinent past medical history.  There are no problems to display for this patient.   History reviewed. No pertinent surgical history.     Family History  Family history unknown: Yes    Social History   Tobacco Use  . Smoking status: Never Smoker  . Smokeless tobacco: Never Used  Substance Use Topics  . Alcohol use: No  . Drug use: No    Home Medications Prior to Admission medications   Medication Sig Start Date End Date Taking? Authorizing Provider  finasteride (PROSCAR) 5 MG tablet Take 5 mg by mouth daily.    [provider]  ibuprofen (ADVIL,MOTRIN) 800 MG tablet Take 800 mg by mouth 2 (two) times daily as needed for fever.    [provider]  Meth-Hyo-M Bl-Na Phos-Ph Sal (URIBEL) 118 MG CAPS Take 1 capsule (118 mg total) by mouth 3 (three) times daily as needed (Dysuria). 03/22/19   Ward, Chase Picket, PA-C  omeprazole (PRILOSEC) 20 MG capsule Take 1 capsule (20 mg total) by mouth daily. 01/16/19   Cathie Hoops, Amy V, PA-C  omeprazole (PRILOSEC) 20 MG capsule Take 1 capsule (20 mg total) by mouth daily for 21 days. 04/04/20 04/25/20  Carroll Sage, PA-C  polyethylene glycol (MIRALAX) 17 g packet Take 17 g by mouth daily. 01/16/19   Belinda Fisher, PA-C    Allergies    Patient has no known allergies.  Review of Systems   Review of Systems  Constitutional: Negative for chills and fever.  HENT: Negative for congestion, tinnitus, trouble swallowing and voice change.   Eyes: Negative for visual disturbance.  Respiratory: Positive for chest tightness. Negative for cough and shortness of breath.   Cardiovascular: Negative for chest pain and palpitations.  Gastrointestinal: Positive for nausea. Negative for abdominal pain, diarrhea and vomiting.       Epigastric pain  Genitourinary: Negative for enuresis, flank pain and frequency.  Musculoskeletal: Negative for back pain.  Skin: Negative for rash.  Neurological: Negative for dizziness.  Hematological: Does not bruise/bleed easily.    Physical Exam Updated Vital Signs BP 101/77   Pulse 63  Temp 98.3 F (36.8 C) (Oral)   Resp 15   Ht 5\' 3"  (1.6 m)   Wt 54 kg   SpO2 100%   BMI 21.09 kg/m   Physical Exam Vitals and nursing note reviewed.  Constitutional:      General: He is not in acute distress.    Appearance: He is not ill-appearing.  HENT:     Head: Normocephalic and atraumatic.     Nose: No congestion.     Mouth/Throat:     Mouth: Mucous membranes are moist.     Pharynx: Oropharynx is clear. No oropharyngeal exudate or posterior  oropharyngeal erythema.  Eyes:     General: No scleral icterus. Cardiovascular:     Rate and Rhythm: Normal rate and regular rhythm.     Pulses: Normal pulses.     Heart sounds: No murmur heard.  No friction rub. No gallop.   Pulmonary:     Effort: No respiratory distress.     Breath sounds: No wheezing, rhonchi or rales.  Abdominal:     General: There is no distension.     Palpations: Abdomen is soft.     Tenderness: There is no abdominal tenderness. There is no right CVA tenderness, left CVA tenderness or guarding.     Comments: Abdomen was visualized, nondistended, normoactive bowel sounds, dull to percussion, slight epigastric pain noted upon palpation, no acute abdomen noted negative CVA tenderness  Musculoskeletal:        General: No swelling.     Right lower leg: No edema.     Left lower leg: No edema.  Skin:    General: Skin is warm and dry.     Capillary Refill: Capillary refill takes less than 2 seconds.     Findings: No rash.  Neurological:     General: No focal deficit present.     Mental Status: He is alert and oriented to person, place, and time.  Psychiatric:        Mood and Affect: Mood normal.     ED Results / Procedures / Treatments   Labs (all labs ordered are listed, but only abnormal results are displayed) Labs Reviewed  BASIC METABOLIC PANEL - Abnormal; Notable for the following components:      Result Value   CO2 21 (*)    Glucose, Bld 100 (*)    All other components within normal limits  CBC  LIPASE, BLOOD  HEPATIC FUNCTION PANEL  URINALYSIS, ROUTINE W REFLEX MICROSCOPIC  TROPONIN I (HIGH SENSITIVITY)  TROPONIN I (HIGH SENSITIVITY)    EKG EKG Interpretation  Date/Time:  Monday April 03 2020 19:41:29 EDT Ventricular Rate:  74 PR Interval:  134 QRS Duration: 108 QT Interval:  356 QTC Calculation: 395 R Axis:   137 Text Interpretation: Normal sinus rhythm Right axis deviation Incomplete right bundle branch block Right ventricular  hypertrophy Abnormal ECG No old tracing to compare Confirmed by [id number] (Dione Booze) on 04/03/2020 11:47:26 PM   Radiology DG Chest 2 View  Result Date: 04/03/2020 CLINICAL DATA:  Chest pain EXAM: CHEST - 2 VIEW COMPARISON:  None. FINDINGS: The heart size and mediastinal contours are within normal limits. Both lungs are clear. The visualized skeletal structures are unremarkable. IMPRESSION: No active cardiopulmonary disease. Electronically Signed   By: 04/05/2020 M.D.   On: 04/03/2020 20:06    Procedures Procedures (including critical care time)  Medications Ordered in ED Medications - No data to display  ED Course  I have reviewed the triage  vital signs and the nursing notes.  Pertinent labs & imaging results that were available during my care of the patient were reviewed by me and considered in my medical decision making (see chart for details).    MDM Rules/Calculators/A&P                          I have personally reviewed all imaging, labs and have interpreted them.  Patient presents with epigastric pain x3 weeks and chest pain x3 days.  He was alert and oriented, did not appear in acute stress, vital signs reassuring.  On exam patient's lung sounds were clear bilaterally, he has slight epigastric pain upon palpation, no acute abdomen noted, no pedal edema noted.  Will order screening labs and imaging for further evaluation.  CBC does not show leukocytosis or signs of anemia, initial troponin was less than 2, BMP does not show electrolyte abnormalities no signs of AKI.  UA negative for nitrates or leukocytes. Chest x-ray was unremarkable, EKG was sinus rhythm without signs of ischemia no ST elevation or depression noted.  I have low suspicion for acute cardiac abnormality as patient is currently not in any chest pain now, last chest pain was yesterday afternoon, he has no associated symptoms, little cardiac risk factors, initial troponin was negative, EKG without signs of  ischemia.  Deferred second troponin as last episode of chest pain was more than 10 hours ago.  Low suspicion for acute abdomen abnormality requiring surgical intervention as patient denies abdominal pain, tolerating p.o., no acute abdomen noted on exam.  Low suspicion for pyelonephritis or UTI as patient denies urinary symptoms, negative CVA tenderness, UA negative for nitrates or leukocytes.  I suspect patient's pain is secondary to acid reflux which would explain his chest tightness and epigastric pain which he feels mainly while he lays down.  Will start him on PPI, provide information on a healthy food choices   and have him follow-up with PCP for further evaluation   Patient appears to be resting comfortably, vital signs reassuring, no indication for hospital admission.  Patient was given at home care as well as strict return precautions.  Patient verbalized that he understood and agreed to plan. Final Clinical Impression(s) / ED Diagnoses Final diagnoses:  Atypical chest pain  Epigastric pain    Rx / DC Orders ED Discharge Orders         Ordered    omeprazole (PRILOSEC) 20 MG capsule  Daily        04/04/20 1232           Carroll Sage, PA-C 04/04/20 1316    Melene Plan, DO 04/04/20 [phone]

## 2020-04-04 NOTE — ED Notes (Signed)
Sort call, no response

## 2020-04-04 NOTE — Discharge Instructions (Addendum)
You have been seen for epigastric pain and chest pain.  Lab work and imaging all look reassuring.  I have placed you on an acid pill please take as prescribed.  Also given you information for healthy food choices to help you avoid acid reflux.  Please stay away from acidic, spicy, foods as well as carbonated drinks as this can worsen your acid reflux.  I gave you the information for [facility] they work with individuals with little to no insurance they can help You find a primary care provider.  Please contact them as I feel you need a primary care provider to help manage your chronic diseases.  I want to come back to emergency department if develop chest pain, shortness of breath, uncontrolled nausea, vomiting, diarrhea, severe abdominal pain, or swelling in your feet as these symptoms require further evaluation management.

## 2020-04-04 NOTE — ED Notes (Signed)
Patient verbalizes understanding of discharge instructions. Opportunity for questioning and answers were provided. Arm band removed by staff, patient discharged from ED. 

## 2021-04-29 IMAGING — CT CT ABD-PELV W/ CM
2 of 5 series · 16 of 46 positions shown, 18 images · IV contrast (Omni 300)
Comparison: 03/15/2014

CLINICAL DATA: Abdominal pain, back pain, dysuria

EXAM:
CT ABDOMEN AND PELVIS WITH CONTRAST
TECHNIQUE: Multidetector CT imaging of the abdomen and pelvis was performed
using the standard protocol following bolus administration of
intravenous contrast.
CONTRAST:  100mL OMNIPAQUE IOHEXOL 300 MG/ML  SOLN

[Series 3: a/p w/ 5mm · axial · 0.65mm/px · z∈[+781,+1191]mm · 13 of 92 slices shown, 15 images]
[im 5/92  soft-tissue]
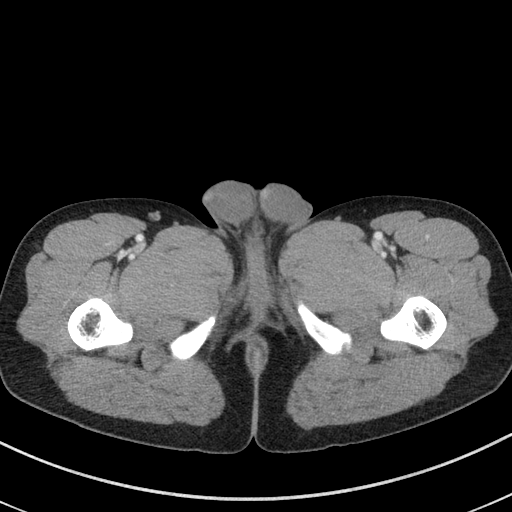
[im 5/92  bone]
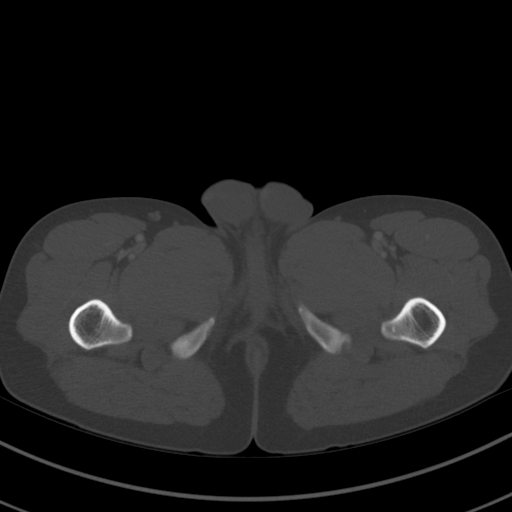
[im 14/92  soft-tissue]
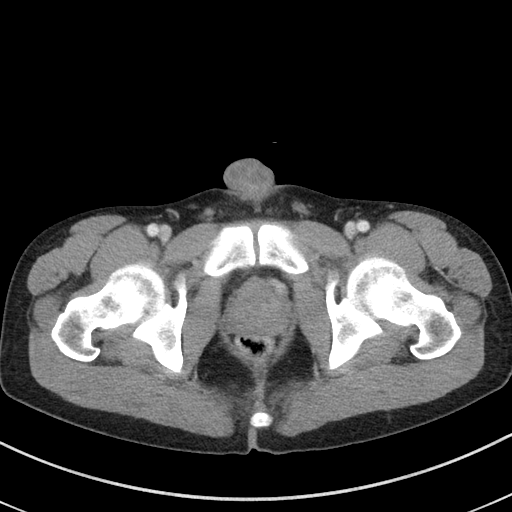
[im 18/92  soft-tissue]
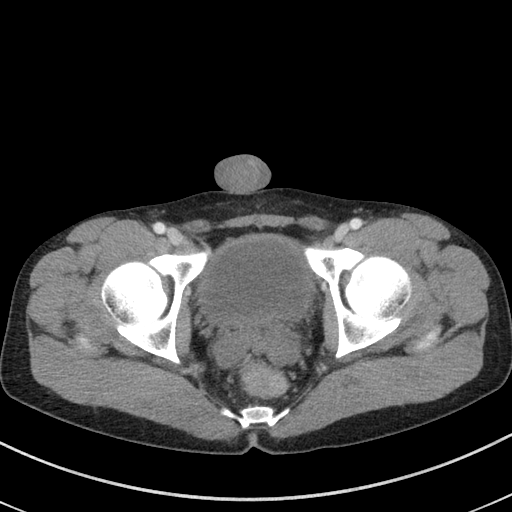
[im 27/92  soft-tissue]
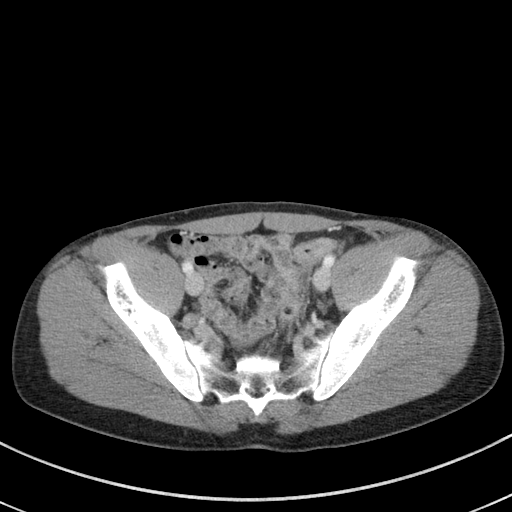
[im 31/92  soft-tissue]
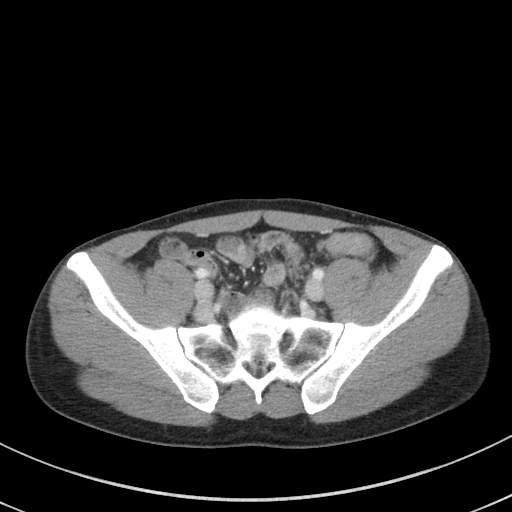
[im 40/92  soft-tissue]
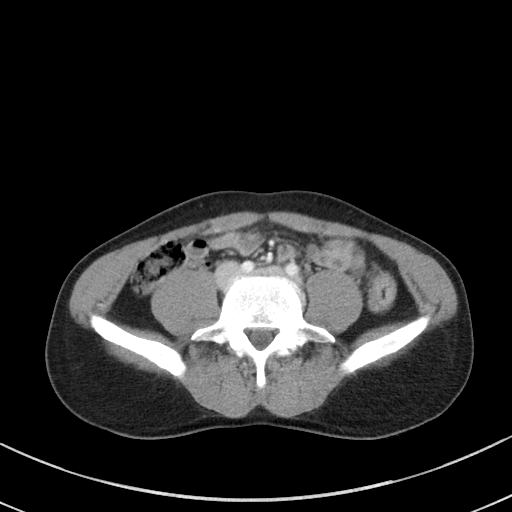
[im 48/92  soft-tissue]
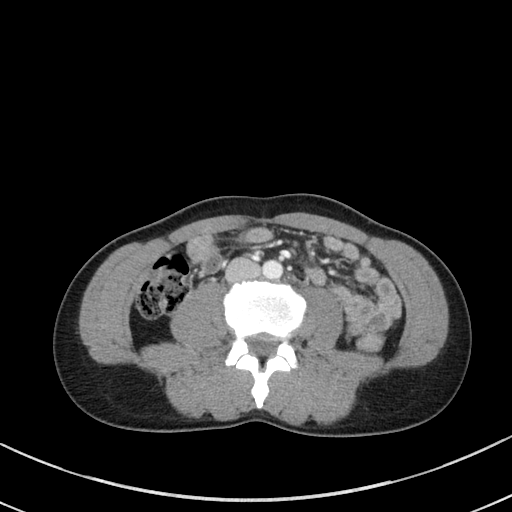
[im 53/92  soft-tissue]
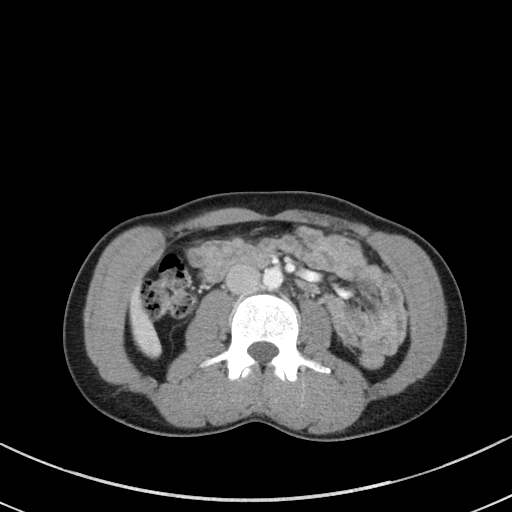
[im 61/92  soft-tissue]
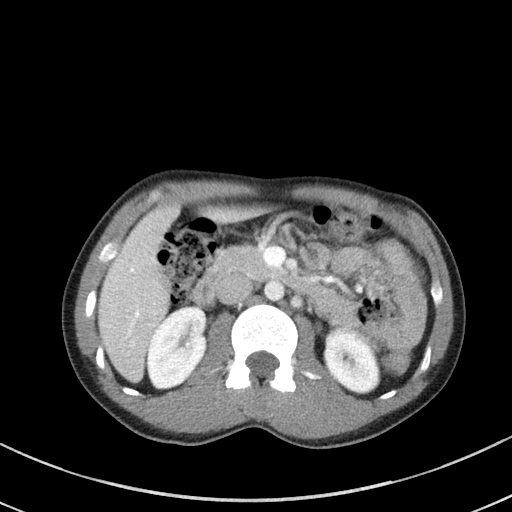
[im 61/92  bone]
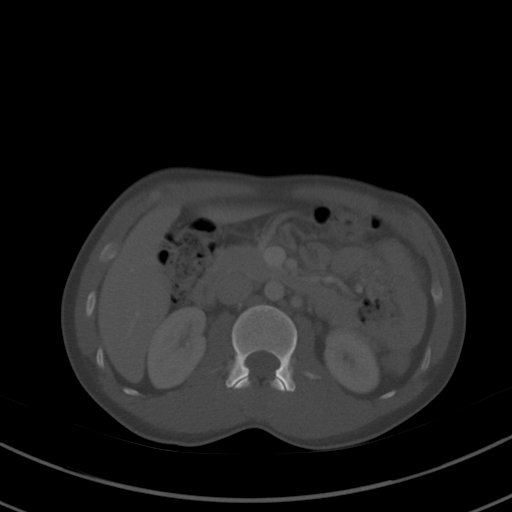
[im 66/92  soft-tissue]
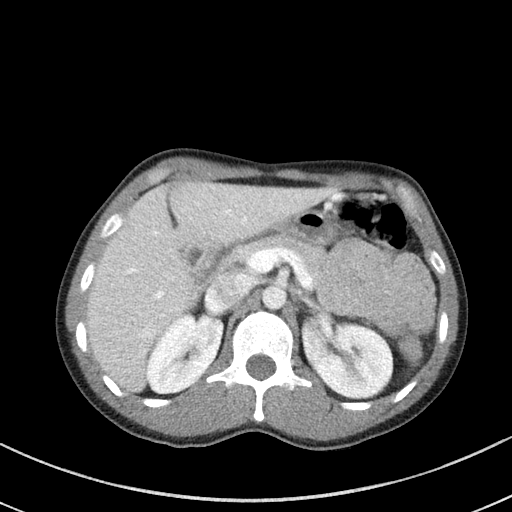
[im 74/92  soft-tissue]
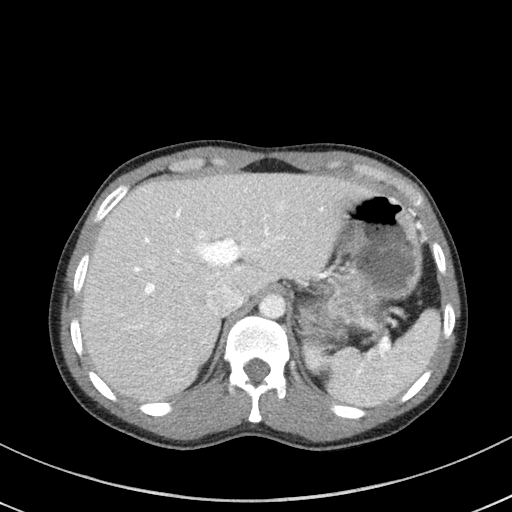
[im 79/92  soft-tissue]
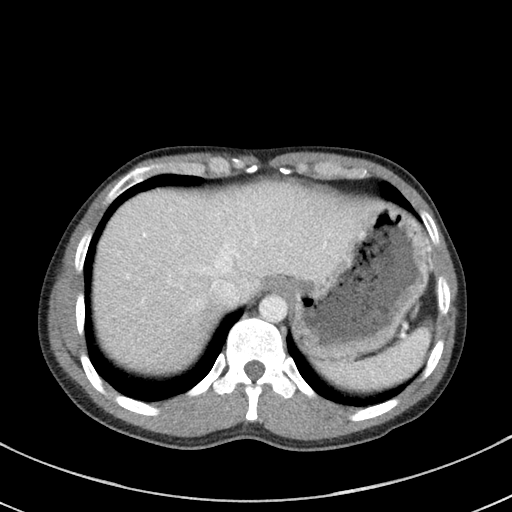
[im 87/92  soft-tissue]
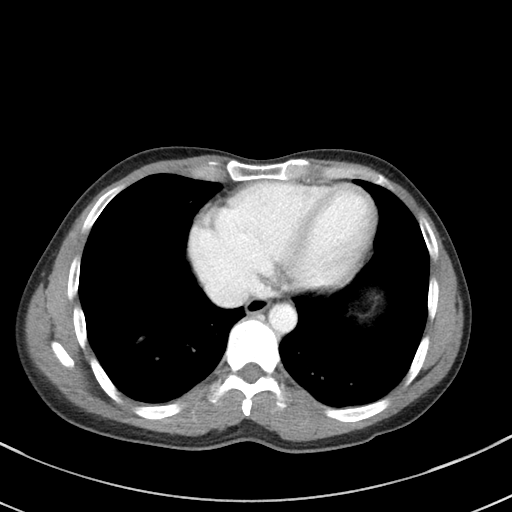

[Series 6: a/p w/ cor · coronal · 0.64mm/px · 3 of 106 slices shown]
[im 36/106  soft-tissue]
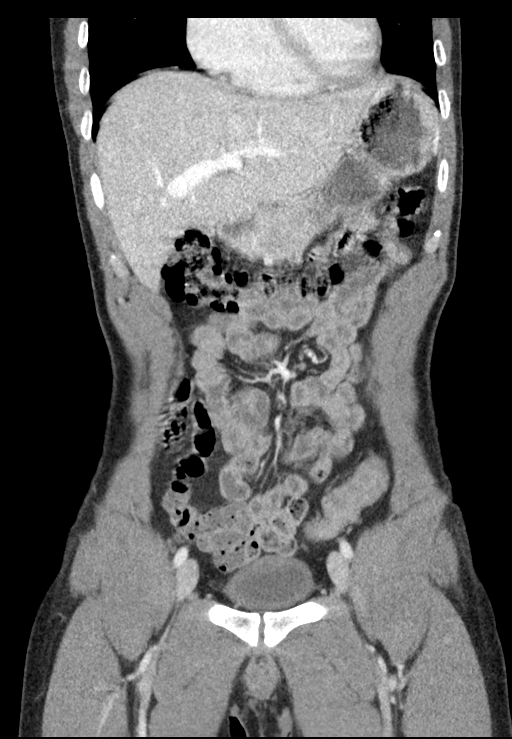
[im 47/106  soft-tissue]
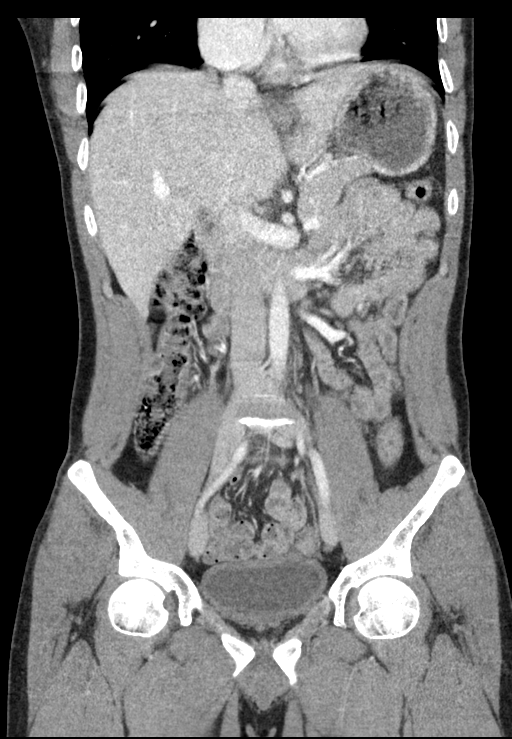
[im 59/106  soft-tissue]
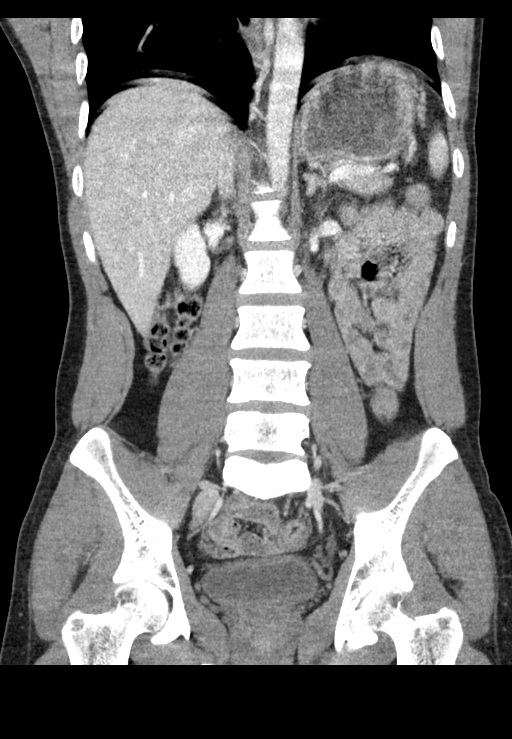

[16 of 46 positions shown; findings below may reference images not displayed]

FINDINGS: Lower chest: No acute abnormality.

Hepatobiliary: No solid liver abnormality is seen. No gallstones,
gallbladder wall thickening, or biliary dilatation.

Pancreas: Unremarkable. No pancreatic ductal dilatation or
surrounding inflammatory changes.

Spleen: Normal in size without significant abnormality.

Adrenals/Urinary Tract: Adrenal glands are unremarkable. Kidneys are
normal, without renal calculi, solid lesion, or hydronephrosis. Mild
thickening of the urinary bladder.

Stomach/Bowel: Stomach is within normal limits. Appendix appears
normal. No evidence of bowel wall thickening, distention, or
inflammatory changes.

Vascular/Lymphatic: No significant vascular findings are present. No
enlarged abdominal or pelvic lymph nodes.

Reproductive: No mass or other significant abnormality.

Other: No abdominal wall hernia or abnormality. No abdominopelvic
ascites.

Musculoskeletal: No acute or significant osseous findings.
IMPRESSION: 1. Mild thickening of the urinary bladder, suggestive of nonspecific
infectious or inflammatory cystitis. Correlate with urinalysis.

2.  No evidence of urinary tract calculus or hydronephrosis.

## 2021-08-22 ENCOUNTER — Telehealth: Payer: Self-pay

## 2021-08-22 NOTE — Telephone Encounter (Signed)
Recv'd records from [facility] forwarded via fax 10 pages to [facility] [id number]/1/23fbg

## 2022-05-12 IMAGING — CR DG CHEST 2V
2 series · 2 of 2 positions shown · non-contrast
Comparison: None.

CLINICAL DATA: Chest pain

EXAM:
CHEST - 2 VIEW

[chest pa]
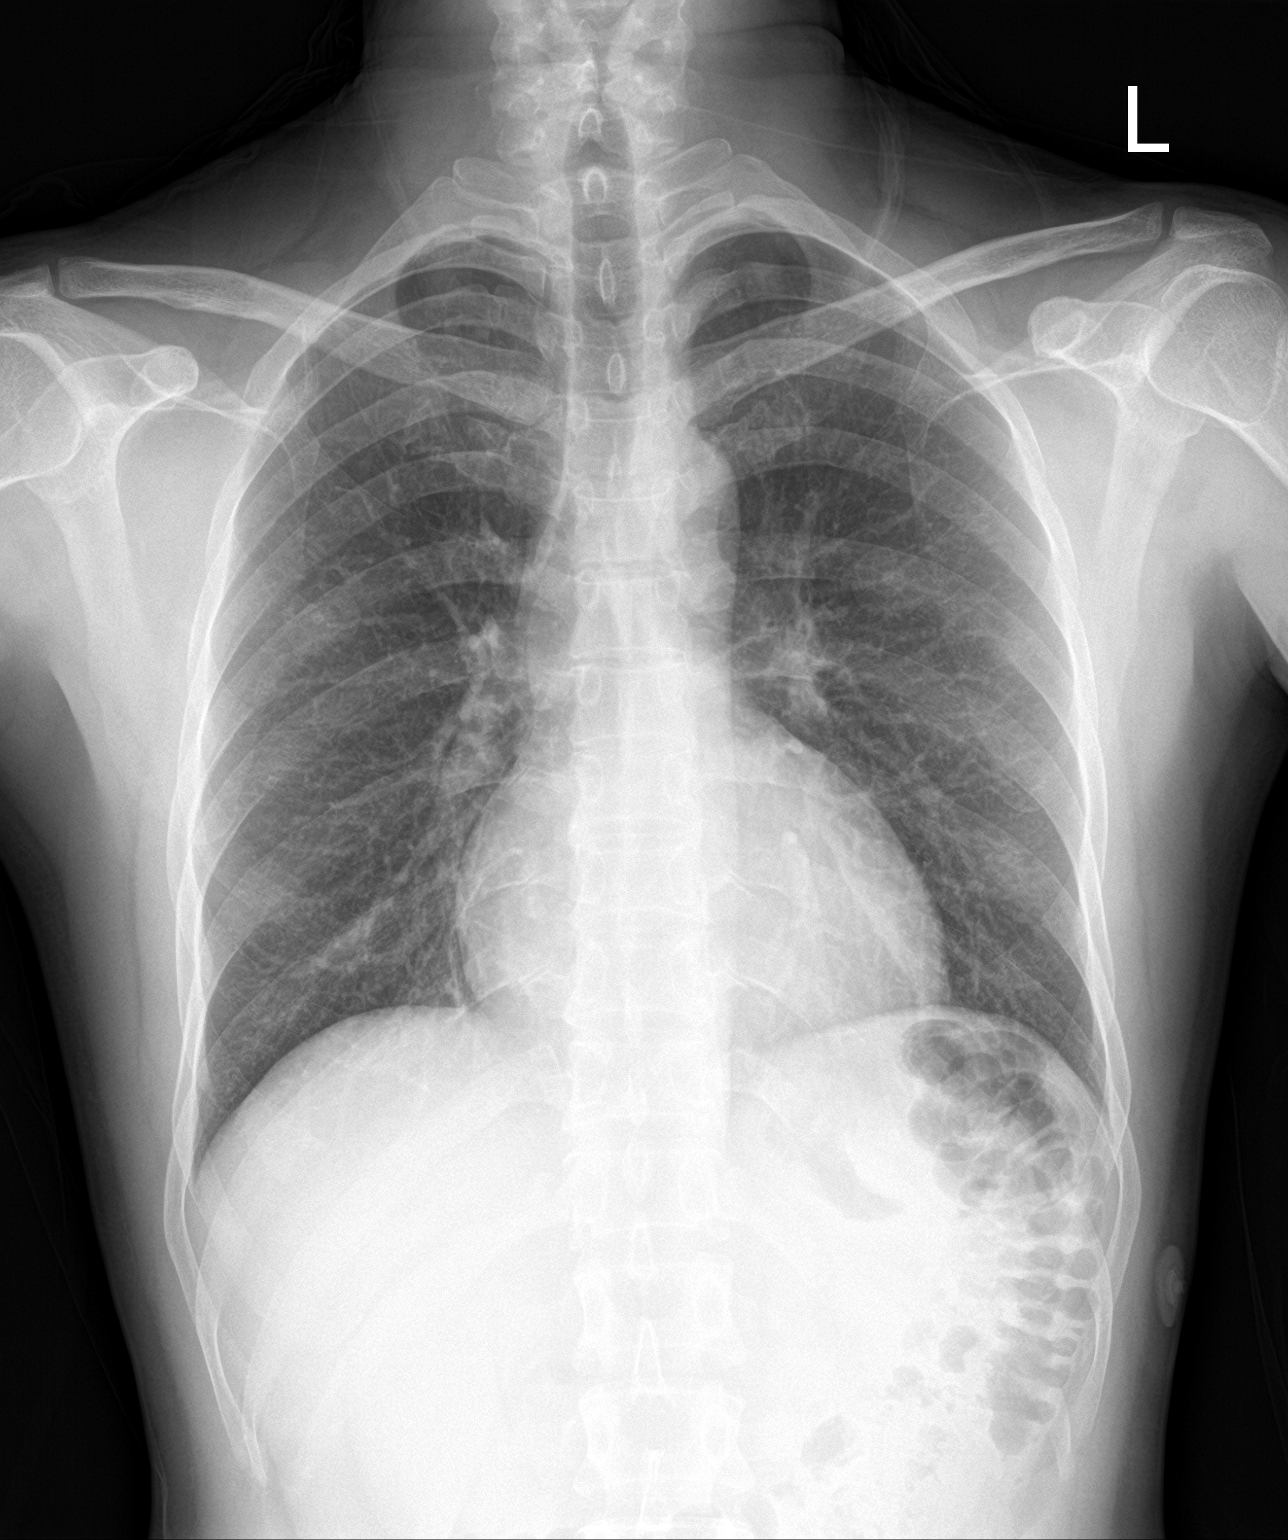

[chest lat]
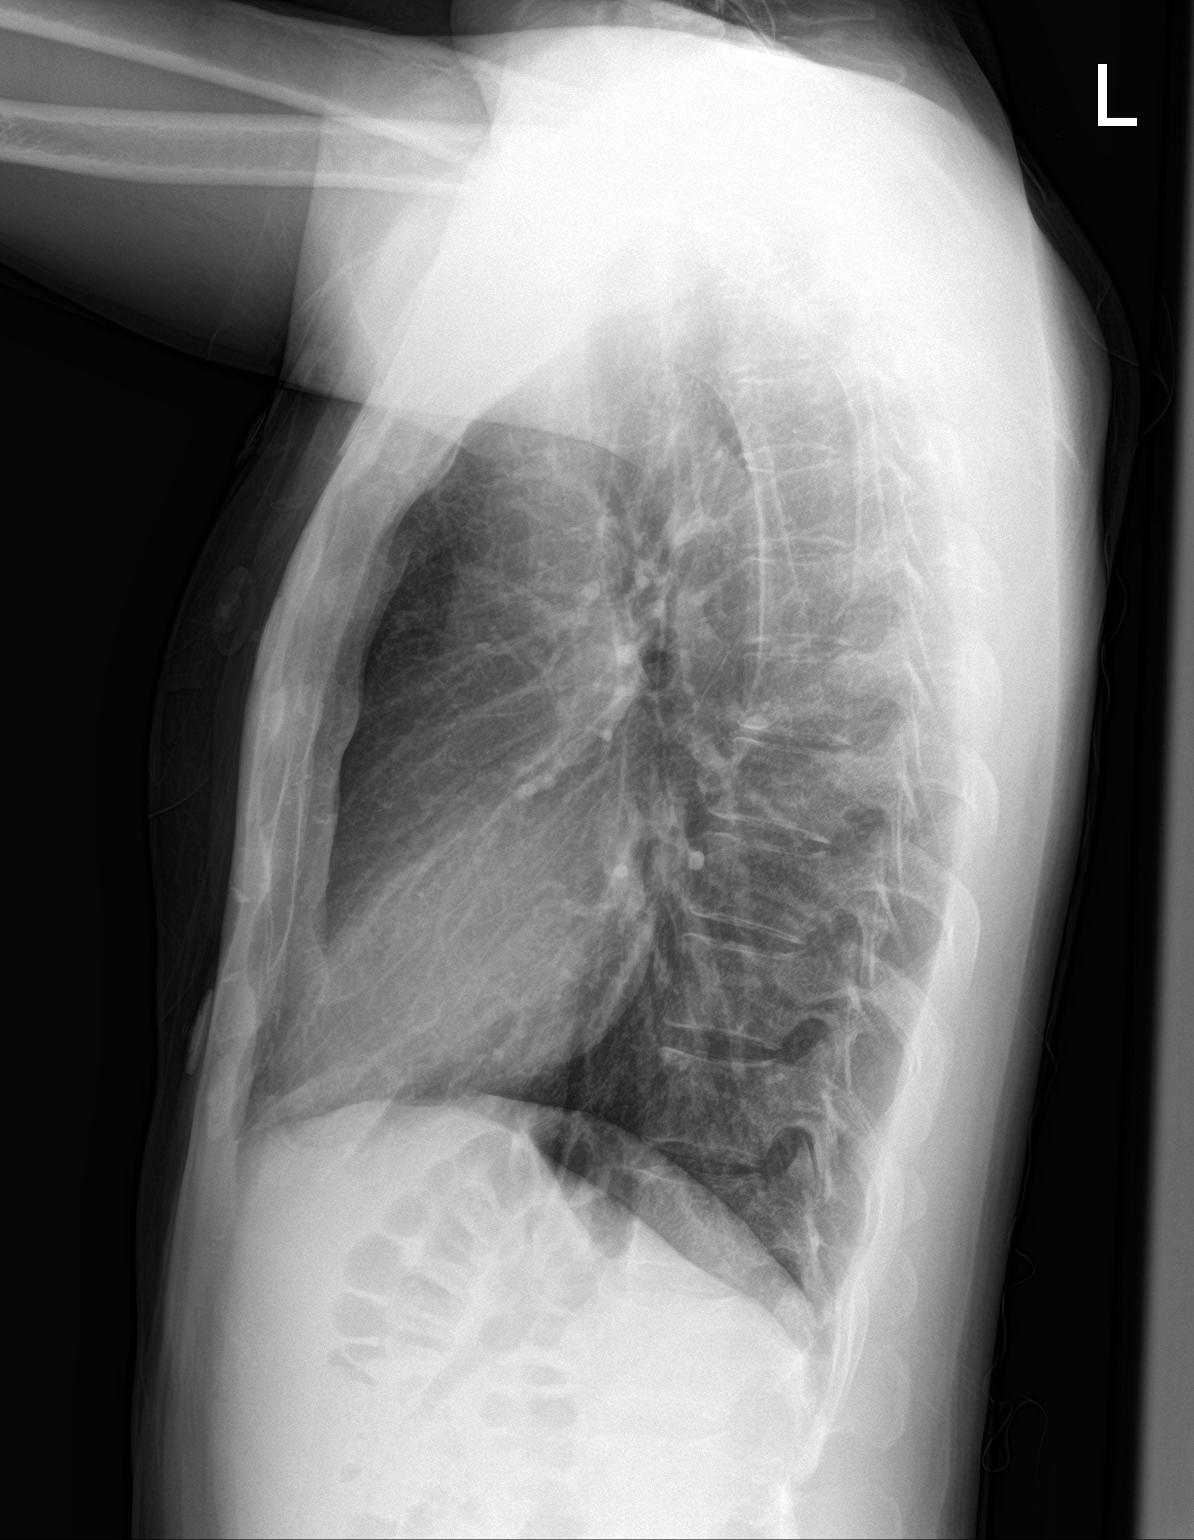

[2 of 2 positions shown; findings below may reference images not displayed]

FINDINGS: The heart size and mediastinal contours are within normal limits.
Both lungs are clear. The visualized skeletal structures are
unremarkable.
IMPRESSION: No active cardiopulmonary disease.

## 2024-03-26 ENCOUNTER — Other Ambulatory Visit: Payer: Self-pay | Admitting: Internal Medicine

## 2024-03-26 DIAGNOSIS — N5089 Other specified disorders of the male genital organs: Secondary | ICD-10-CM
